# Patient Record
Sex: Female | Born: 1980 | Hispanic: No | Marital: Married | Smoking: Never smoker
Health system: Southern US, Community
[De-identification: ages and names within clinical notes are randomized; demographics above are authoritative.]

## PROBLEM LIST (undated history)

## (undated) DIAGNOSIS — J45909 Unspecified asthma, uncomplicated: Secondary | ICD-10-CM

## (undated) HISTORY — DX: Unspecified asthma, uncomplicated: J45.909

---

## 2005-01-28 ENCOUNTER — Other Ambulatory Visit: Admission: RE | Admit: 2005-01-28 | Discharge: 2005-01-28 | Payer: Self-pay | Admitting: Family Medicine

## 2006-02-01 ENCOUNTER — Other Ambulatory Visit: Admission: RE | Admit: 2006-02-01 | Discharge: 2006-02-01 | Payer: Self-pay | Admitting: Family Medicine

## 2007-02-24 ENCOUNTER — Other Ambulatory Visit: Admission: RE | Admit: 2007-02-24 | Discharge: 2007-02-24 | Payer: Self-pay | Admitting: Family Medicine

## 2009-05-15 ENCOUNTER — Ambulatory Visit: Payer: Self-pay | Admitting: Sports Medicine

## 2009-05-15 DIAGNOSIS — M79609 Pain in unspecified limb: Secondary | ICD-10-CM

## 2009-05-15 DIAGNOSIS — M21069 Valgus deformity, not elsewhere classified, unspecified knee: Secondary | ICD-10-CM | POA: Insufficient documentation

## 2009-05-15 DIAGNOSIS — M21619 Bunion of unspecified foot: Secondary | ICD-10-CM

## 2009-08-21 ENCOUNTER — Other Ambulatory Visit: Admission: RE | Admit: 2009-08-21 | Discharge: 2009-08-21 | Payer: Self-pay | Admitting: Obstetrics and Gynecology

## 2011-07-21 ENCOUNTER — Ambulatory Visit (INDEPENDENT_AMBULATORY_CARE_PROVIDER_SITE_OTHER): Payer: 59 | Admitting: Family Medicine

## 2011-07-21 VITALS — BP 128/80 | Ht 66.0 in | Wt 175.0 lb

## 2011-07-21 DIAGNOSIS — M24859 Other specific joint derangements of unspecified hip, not elsewhere classified: Secondary | ICD-10-CM

## 2011-07-21 DIAGNOSIS — R29898 Other symptoms and signs involving the musculoskeletal system: Secondary | ICD-10-CM

## 2011-07-21 NOTE — Patient Instructions (Signed)
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   This is from "popping" of your iliopsoas muscle.  This is explained in the handout.  The pain is coming an irritation hip adductors.  Anti-inflammatory medications such aleve, advil, ibuprofen, naproxen will help with inflammation.  Follow up in 3-4 weeks.

## 2011-08-11 DIAGNOSIS — M24859 Other specific joint derangements of unspecified hip, not elsewhere classified: Secondary | ICD-10-CM | POA: Insufficient documentation

## 2011-08-11 NOTE — Progress Notes (Signed)
  Subjective:    Patient ID: Roberta Christian, female    DOB: [birth year], 31 y.o.   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  Over the past month she noticed that she gets pain in the groin with positions in yoga and pilates that require external rotation of the hip. She has tried stretching and strengthening of her core muscles but she still has the pain.  No specific injury.  Her other hobbies are cycling and running.  She doesn't have pain with normal daily activity.No back pain.  No pain radiating down the legs.  No posterior hip pain.   Review of Systems     Objective:   Physical Exam  Right Hip: Palpable and audible snap in the groin with passive and active flexion of the hip The ROM is normal in all directions FABER is neutral but is painful in the groin There is tenderness to palpation over adductors which reproduces the pain she is complaining of Strength is normal No tenderness to palpation of the greater trochanter or the gluteus muscles Gait is normal       Assessment & Plan:

## 2011-08-11 NOTE — Assessment & Plan Note (Signed)
We have given her a handout with a HEP for snapping hip.  This is not likely the cause of her adductor pain.  I expect this is an overuse symptom so she will modify her activity to not aggravate the symptoms and use anti-inflammatory meds PRN.  Her symptoms should resolve with the HEP but if they don't she will return and we will consider formal PT at that time.

## 2011-08-18 ENCOUNTER — Ambulatory Visit: Payer: Self-pay | Admitting: Family Medicine

## 2011-11-01 ENCOUNTER — Other Ambulatory Visit: Payer: Self-pay

## 2011-11-11 ENCOUNTER — Ambulatory Visit
Admission: RE | Admit: 2011-11-11 | Discharge: 2011-11-11 | Disposition: A | Discharge status: Home / Self Care | Payer: 59 | Source: Ambulatory Visit | Attending: Allergy and Immunology | Admitting: Allergy and Immunology

## 2011-11-11 ENCOUNTER — Other Ambulatory Visit: Payer: Self-pay | Admitting: Allergy and Immunology

## 2011-11-11 DIAGNOSIS — J45909 Unspecified asthma, uncomplicated: Secondary | ICD-10-CM

## 2013-04-21 IMAGING — CR DG CHEST 2V
2 series · 2 of 2 positions shown · non-contrast
Comparison: None

CLINICAL DATA: Asthma exacerbation

CHEST - 2 VIEW

[view not recorded (1 of 2)]
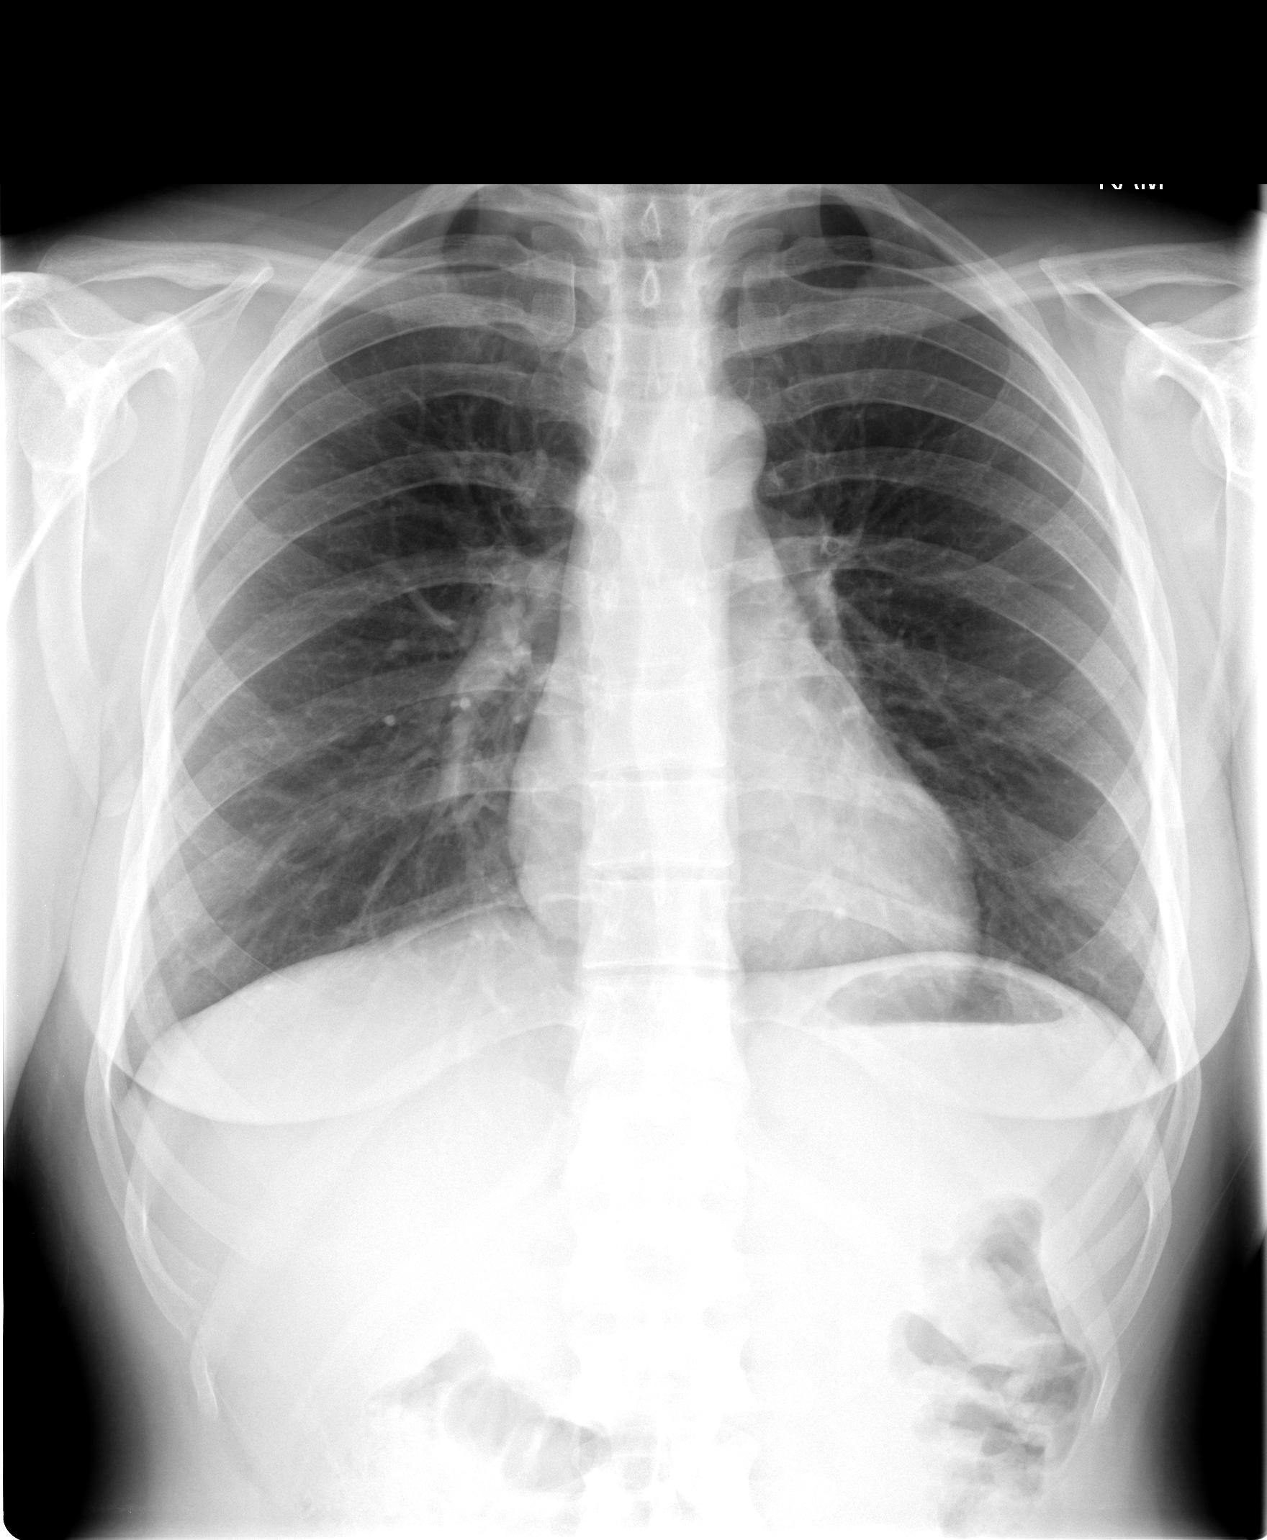

[view not recorded (2 of 2)]
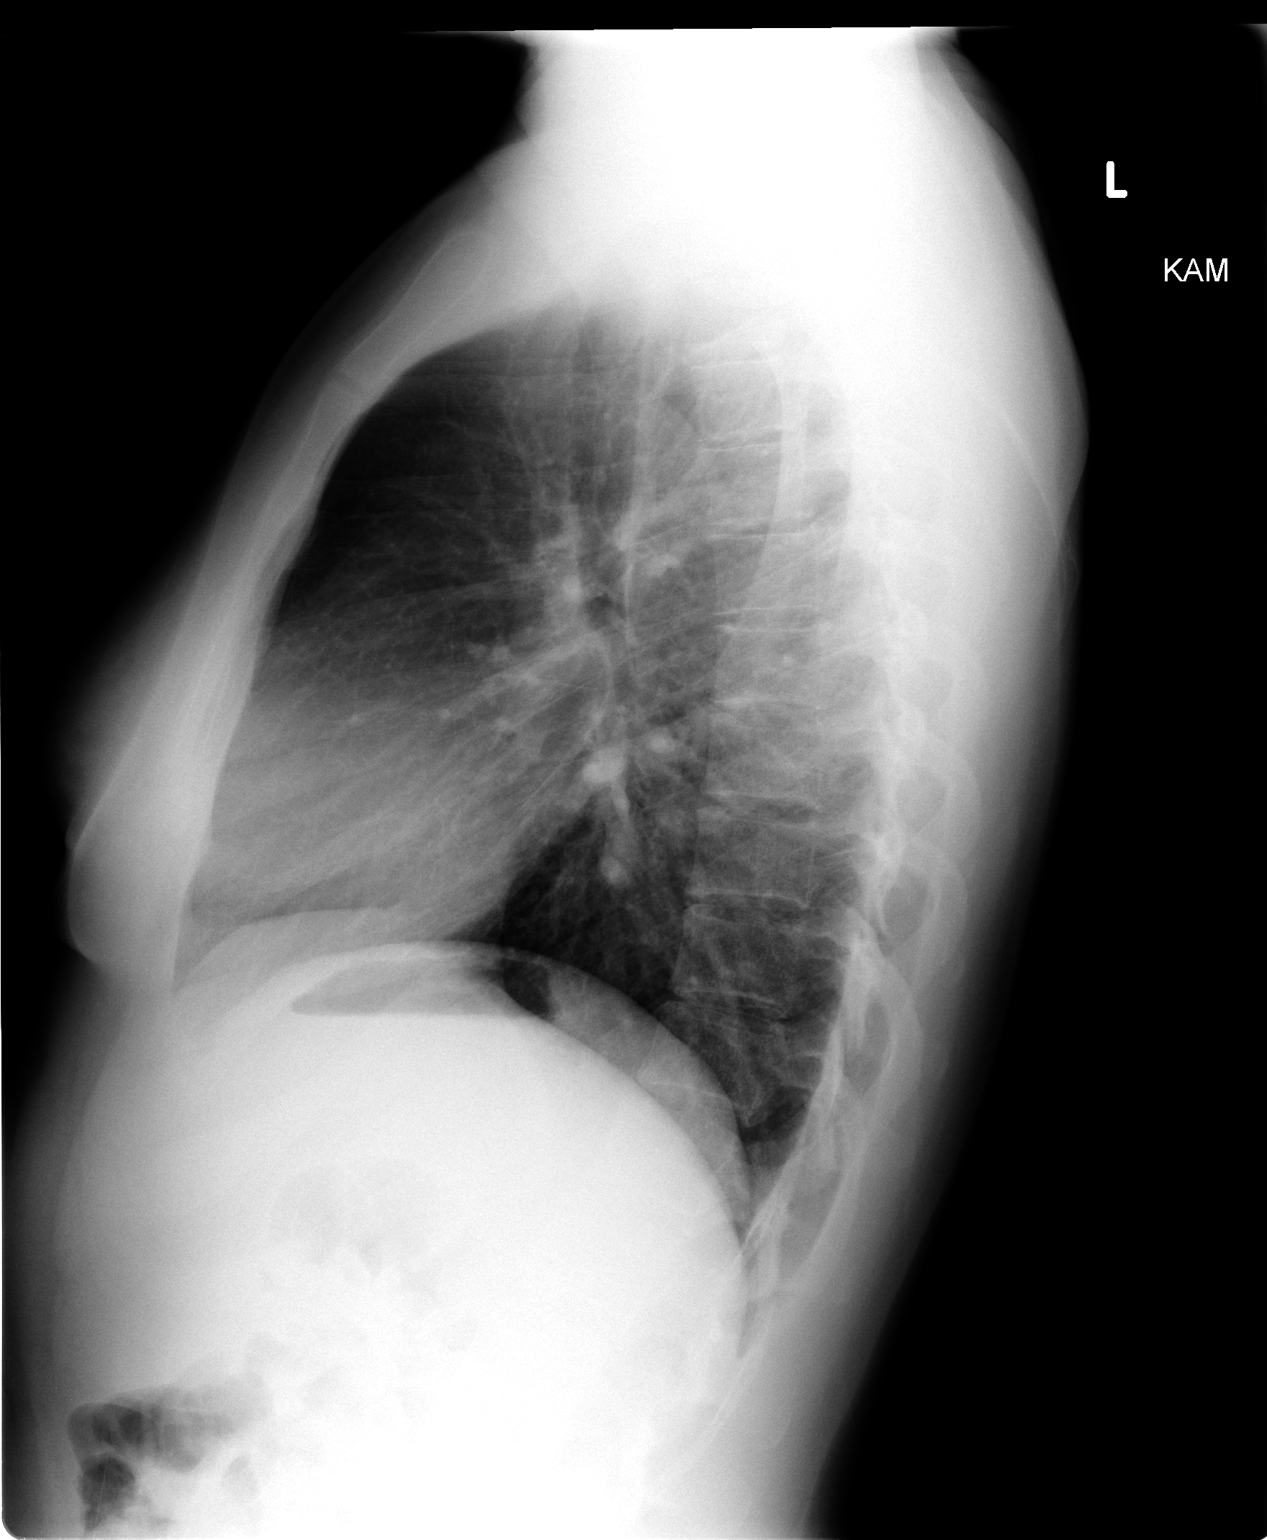

[2 of 2 positions shown; findings below may reference images not displayed]

FINDINGS: The heart size and mediastinal contours are within normal
limits.  Both lungs are clear.  The visualized skeletal structures
are unremarkable.
IMPRESSION: Negative exam.

## 2013-07-10 ENCOUNTER — Ambulatory Visit: Payer: 59

## 2013-07-11 ENCOUNTER — Ambulatory Visit (INDEPENDENT_AMBULATORY_CARE_PROVIDER_SITE_OTHER): Payer: 59 | Admitting: Emergency Medicine

## 2013-07-11 VITALS — BP 130/80 | HR 55 | Temp 97.9°F | Resp 16 | Ht 66.5 in | Wt 172.6 lb

## 2013-07-11 DIAGNOSIS — L0100 Impetigo, unspecified: Secondary | ICD-10-CM

## 2013-07-11 MED ORDER — SULFAMETHOXAZOLE-TMP DS 800-160 MG PO TABS: 1.0000 | ORAL_TABLET | Freq: Two times a day (BID) | ORAL | Status: AC

## 2013-07-11 NOTE — Progress Notes (Signed)
[facility] [address], [place] [id number] [phone]  Date:  07/11/2013   Name:  Roberta Christian Muehl   DOB:  1981-01-01   MRN:  [id number]  PCP:  Pcp Not In System    Chief Complaint: Rash   History of Present Illness:  Roberta Christian Minchey is a 33 y.o. very pleasant female patient who presents with the following:  Has a lesion on the left temple that is almost a week old. She is concerned that she has been bitten.  No fever or chills.  No improvement with over the counter medications or other home remedies. Denies other complaint or health concern today.   Patient Active Problem List   Diagnosis Date Noted  . 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 BUNIONS, BILATERAL 05/15/2009  . LEG PAIN, RIGHT 05/15/2009  . 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 Asthma     History reviewed. No pertinent past surgical history.  History  Substance Use Topics  . Smoking status: Never Smoker   . Smokeless tobacco: Not on file  . Alcohol Use: 3.6 oz/week    6 Cans of beer per week    Family History  Problem Relation Age of Onset  . Hypertension Father   . Diabetes Father   . Mental illness Brother   . Alcohol abuse Maternal Grandfather   . Mental illness Maternal Grandfather   . Cancer Paternal Grandmother   . Heart disease Paternal Grandfather     No Known Allergies  Medication list has been reviewed and updated.  No current outpatient prescriptions on file prior to visit.   No current facility-administered medications on file prior to visit.    Review of Systems:  As per HPI, otherwise negative.    Physical Examination: Filed Vitals:   07/11/13 1540  BP: 130/80  Pulse: 55  Temp: 97.9 F (36.6 C)  Resp: 16   Filed Vitals:   07/11/13 1540  Height: 5' 6.5" (1.689 m)  Weight: 172 lb 9.6 oz (78.291 kg)   Body mass index is 27.44 kg/(m^2). Ideal Body Weight: Weight in (lb) to have BMI = 25: 156.9   GEN: WDWN, NAD,  Non-toxic, Alert & Oriented x 3 HEENT: Atraumatic, Normocephalic.  Ears and Nose: No external deformity. EXTR: No clubbing/cyanosis/edema NEURO: Normal gait.  PSYCH: Normally interactive. Conversant. Not depressed or anxious appearing.  Calm demeanor.  LEFT temple:  Vesicular eruption  Assessment and Plan: Impetigo vs contact dermatitis Septra  Signed,  Phillips OdorJeffery Annamary Buschman, MD

## 2013-07-11 NOTE — Patient Instructions (Signed)
Impetigo Impetigo is an infection of the skin, most common in babies and children.  CAUSES  It is caused by staphylococcal or streptococcal germs (bacteria). Impetigo can start after any damage to the skin. The damage to the skin may be from things like:   Chickenpox.  Scrapes.  Scratches.  Insect bites (common when children scratch the bite).  Cuts.  Nail biting or chewing. Impetigo is contagious. It can be spread from one person to another. Avoid close skin contact, or sharing towels or clothing. SYMPTOMS  Impetigo usually starts out as small blisters or pustules. Then they turn into tiny yellow-crusted sores (lesions).  There may also be:  Large blisters.  Itching or pain.  Pus.  Swollen lymph glands. With scratching, irritation, or non-treatment, these small areas may get larger. Scratching can cause the germs to get under the fingernails; then scratching another part of the skin can cause the infection to be spread there. DIAGNOSIS  Diagnosis of impetigo is usually made by a physical exam. A skin culture (test to grow bacteria) may be done to prove the diagnosis or to help decide the best treatment.  TREATMENT  Mild impetigo can be treated with prescription antibiotic cream. Oral antibiotic medicine may be used in more severe cases. Medicines for itching may be used. HOME CARE INSTRUCTIONS   To avoid spreading impetigo to other body areas:  Keep fingernails short and clean.  Avoid scratching.  Cover infected areas if necessary to keep from scratching.  Gently wash the infected areas with antibiotic soap and water.  Soak crusted areas in warm soapy water using antibiotic soap.  Gently rub the areas to remove crusts. Do not scrub.  Wash hands often to avoid spread this infection.  Keep children with impetigo home from school or daycare until they have used an antibiotic cream for 48 hours (2 days) or oral antibiotic medicine for 24 hours (1 day), and their skin  shows significant improvement.  Children may attend school or daycare if they only have a few sores and if the sores can be covered by a bandage or clothing. SEEK MEDICAL CARE IF:   More blisters or sores show up despite treatment.  Other family members get sores.  Rash is not improving after 48 hours (2 days) of treatment. SEEK IMMEDIATE MEDICAL CARE IF:   You see spreading redness or swelling of the skin around the sores.  You see red streaks coming from the sores.  Your child develops a fever of 100.4 F (37.2 C) or higher.  Your child develops a sore throat.  Your child is acting ill (lethargic, sick to their stomach). Document Released: 05/28/2000 Document Revised: 08/23/2011 Document Reviewed: 03/27/2008 [employer] Patient Information 2014 [employer]
